# Patient Record
Sex: Male | Born: 1945 | Race: White | Hispanic: No | Marital: Married | Smoking: Never smoker
Health system: Southern US, Community
[De-identification: ages and names within clinical notes are randomized; demographics above are authoritative.]

## PROBLEM LIST (undated history)

## (undated) DIAGNOSIS — E119 Type 2 diabetes mellitus without complications: Secondary | ICD-10-CM

## (undated) DIAGNOSIS — N200 Calculus of kidney: Secondary | ICD-10-CM

## (undated) DIAGNOSIS — I10 Essential (primary) hypertension: Secondary | ICD-10-CM

## (undated) DIAGNOSIS — K219 Gastro-esophageal reflux disease without esophagitis: Secondary | ICD-10-CM

## (undated) HISTORY — DX: Essential (primary) hypertension: I10

## (undated) HISTORY — PX: KNEE SURGERY: SHX244

## (undated) HISTORY — DX: Gastro-esophageal reflux disease without esophagitis: K21.9

## (undated) HISTORY — DX: Type 2 diabetes mellitus without complications: E11.9

---

## 1997-08-23 ENCOUNTER — Encounter: Admission: RE | Admit: 1997-08-23 | Discharge: 1997-11-21 | Payer: Self-pay | Admitting: *Deleted

## 2008-07-25 ENCOUNTER — Encounter: Admission: RE | Admit: 2008-07-25 | Discharge: 2008-10-23 | Payer: Self-pay | Admitting: Orthopedic Surgery

## 2008-10-24 ENCOUNTER — Encounter: Admission: RE | Admit: 2008-10-24 | Discharge: 2008-12-04 | Payer: Self-pay | Admitting: Orthopedic Surgery

## 2012-11-09 ENCOUNTER — Ambulatory Visit: Payer: Self-pay | Admitting: Orthopedic Surgery

## 2012-11-23 ENCOUNTER — Ambulatory Visit: Payer: Self-pay | Admitting: Orthopedic Surgery

## 2012-12-07 ENCOUNTER — Ambulatory Visit (INDEPENDENT_AMBULATORY_CARE_PROVIDER_SITE_OTHER): Payer: Medicare Other

## 2012-12-07 ENCOUNTER — Encounter: Payer: Self-pay | Admitting: Orthopedic Surgery

## 2012-12-07 ENCOUNTER — Ambulatory Visit (INDEPENDENT_AMBULATORY_CARE_PROVIDER_SITE_OTHER): Payer: Medicare Other | Admitting: Orthopedic Surgery

## 2012-12-07 VITALS — BP 144/110 | Ht 67.5 in | Wt 238.0 lb

## 2012-12-07 DIAGNOSIS — M1710 Unilateral primary osteoarthritis, unspecified knee: Secondary | ICD-10-CM

## 2012-12-07 DIAGNOSIS — M179 Osteoarthritis of knee, unspecified: Secondary | ICD-10-CM | POA: Insufficient documentation

## 2012-12-07 DIAGNOSIS — Z9889 Other specified postprocedural states: Secondary | ICD-10-CM

## 2012-12-07 DIAGNOSIS — M25569 Pain in unspecified knee: Secondary | ICD-10-CM

## 2012-12-07 DIAGNOSIS — M25561 Pain in right knee: Secondary | ICD-10-CM

## 2012-12-07 DIAGNOSIS — IMO0002 Reserved for concepts with insufficient information to code with codable children: Secondary | ICD-10-CM | POA: Diagnosis not present

## 2012-12-07 MED ORDER — DICLOFENAC POTASSIUM 50 MG PO TABS: 50.0000 mg | ORAL_TABLET | Freq: Two times a day (BID) | ORAL | Status: DC

## 2012-12-07 NOTE — Patient Instructions (Addendum)
Wear and Tear Disorders of the Knee (Arthritis, Osteoarthritis) Everyone will experience wear and tear injuries (arthritis, osteoarthritis) of the knee. These are the changes we all get as we age. They come from the joint stress of daily living. The amount of cartilage damage in your knee and your symptoms determine if you need surgery.    WHAT IS ARTHRITIS OF THE KNEE?  Arthritis of the knee is most often osteoarthritis. In this disease, the cartilage in the joint gradually wears away. It may be caused by excess stress on the joint from:  Trauma.  Deformity.  Repeated injury.  Excess weight.  It most often affects middle-aged and older people. A young person who develops osteoarthritis may have an inherited form of the disease or may have experienced continuous irritation from an unrepaired knee injury or other injury.  In rheumatoid arthritis, which can also affect the knees, the joint becomes inflamed and cartilage may be destroyed. Rheumatoid arthritis often affects people at an earlier age than osteoarthritis and often involves multiple joints.  Arthritis can also affect supporting structures such as muscles, tendons, and ligaments. WHAT ARE SIGNS OF ARTHRITIS OF THE KNEE?  Someone who has arthritis of the knee may experience:  Pain.  Swelling/ fluid on the knee.  A decrease in knee motion.  A common symptom is morning stiffness. This generally improves as the person moves around.  Sometimes the joint locks or clicks. These signs may occur in other knee disorders as well.  The caregiver may confirm the diagnosis by:  Performing a physical examination.  Examining x-rays, which typically show a loss of joint space.  Blood tests may be helpful for diagnosing rheumatoid arthritis, but other tests may be needed.  Analyzing fluid from the knee joint may be helpful in diagnosing some kinds of arthritis.  The caregiver may use arthroscopy to directly see damage to cartilage, tendons, and  ligaments and to confirm a diagnosis. Arthroscopy is usually done only if a repair procedure is to be performed. WHAT IS TREATMENT FOR ARTHRITIS OF THE KNEE?  Most often osteoarthritis of the knee is treated with pain-reducing medicines, such as:  Nonsteroidal anti-inflammatory drugs (NSAID's),  Start medication for arthritis take as needed Exercises to restore joint movement and strengthen the knee.  Losing excess weight can also help people with osteoarthritis.  Rheumatoid arthritis of the knee may require physical therapy and more powerful medicines. In people with severe arthritis of the knee, a seriously damaged joint may need to be replaced with an artificial one.

## 2012-12-07 NOTE — Progress Notes (Signed)
  Subjective:    George Ballard is a 67 y.o. male who presents with knee pain involving the right knee. Onset was gradual, starting about several weeks ago. Inciting event: none known. Current symptoms include: pain located lateral joint line  and swelling. Pain is aggravated by walking and the knee will go into extension with pain . Patient has had prior knee problems. Evaluation to date: none. Treatment to date: none and of note he had SARK menisectomy 16 years ago.  The following portions of the patient's history were reviewed and updated as appropriate: allergies, current medications, past family history, past medical history, past social history, past surgical history and problem list.   Review of Systems A comprehensive review of systems was negative except for: Constitutional: positive for fevers Respiratory: positive for cough Gastrointestinal: positive for dyspepsia and melena Genitourinary: positive for hematuria Musculoskeletal: positive for arthralgias and stiff joints Behavioral/Psych: positive for anxiety   Objective:    BP 144/110  Ht 5' 7.5" (1.715 m)  Wt 238 lb (107.956 kg)  BMI 36.7 kg/m2 Right knee: positive exam findings: effusion, medial joint line tenderness, lateral joint line tenderness, positive McMurray and + Apley and screw home test , negative exam findings: no erythema, ACL stable, PCL stable, MCL stable, LCL stable, no patellar laxity and motor normal  and aligment seems mild varus  Left knee:  normal, no effusion, full active range of motion, no joint line tenderness, ligamentous structures intact. and motor normal   Upper extremity exam  The right and left upper extremity:   Inspection and palpation revealed no abnormalities in the upper extremities.   Range of motion is full without contracture.  Motor exam is normal with grade 5 strength.  The joints are fully reduced without subluxation.  There is no atrophy or tremor and muscle tone is  normal.  All joints are stable.  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 Questions answered. Transport planner distributed. NSAIDs per medication orders.

## 2013-03-07 ENCOUNTER — Ambulatory Visit (INDEPENDENT_AMBULATORY_CARE_PROVIDER_SITE_OTHER): Payer: Medicare Other | Admitting: *Deleted

## 2013-03-07 ENCOUNTER — Encounter (INDEPENDENT_AMBULATORY_CARE_PROVIDER_SITE_OTHER): Payer: Self-pay

## 2013-03-07 DIAGNOSIS — Z23 Encounter for immunization: Secondary | ICD-10-CM

## 2013-03-20 ENCOUNTER — Encounter (HOSPITAL_COMMUNITY): Payer: Self-pay | Admitting: Emergency Medicine

## 2013-03-20 ENCOUNTER — Emergency Department (HOSPITAL_COMMUNITY)
Admission: EM | Admit: 2013-03-20 | Discharge: 2013-03-20 | Disposition: A | Discharge status: Home / Self Care | Payer: Medicare Other | Attending: Emergency Medicine | Admitting: Emergency Medicine

## 2013-03-20 ENCOUNTER — Emergency Department (HOSPITAL_COMMUNITY): Payer: Medicare Other

## 2013-03-20 DIAGNOSIS — Z79899 Other long term (current) drug therapy: Secondary | ICD-10-CM | POA: Diagnosis not present

## 2013-03-20 DIAGNOSIS — N201 Calculus of ureter: Secondary | ICD-10-CM | POA: Diagnosis not present

## 2013-03-20 DIAGNOSIS — N1330 Unspecified hydronephrosis: Secondary | ICD-10-CM | POA: Insufficient documentation

## 2013-03-20 DIAGNOSIS — N200 Calculus of kidney: Secondary | ICD-10-CM

## 2013-03-20 DIAGNOSIS — E119 Type 2 diabetes mellitus without complications: Secondary | ICD-10-CM | POA: Diagnosis not present

## 2013-03-20 DIAGNOSIS — R61 Generalized hyperhidrosis: Secondary | ICD-10-CM | POA: Insufficient documentation

## 2013-03-20 DIAGNOSIS — I10 Essential (primary) hypertension: Secondary | ICD-10-CM | POA: Insufficient documentation

## 2013-03-20 DIAGNOSIS — R110 Nausea: Secondary | ICD-10-CM | POA: Insufficient documentation

## 2013-03-20 DIAGNOSIS — Z87442 Personal history of urinary calculi: Secondary | ICD-10-CM | POA: Insufficient documentation

## 2013-03-20 DIAGNOSIS — K219 Gastro-esophageal reflux disease without esophagitis: Secondary | ICD-10-CM | POA: Insufficient documentation

## 2013-03-20 HISTORY — DX: Calculus of kidney: N20.0

## 2013-03-20 LAB — URINALYSIS, ROUTINE W REFLEX MICROSCOPIC
Bilirubin Urine: NEGATIVE
Nitrite: NEGATIVE
Protein, ur: NEGATIVE mg/dL
Specific Gravity, Urine: 1.024 (ref 1.005–1.030)
Urobilinogen, UA: 0.2 mg/dL (ref 0.0–1.0)

## 2013-03-20 LAB — COMPREHENSIVE METABOLIC PANEL
ALT: 95 U/L — ABNORMAL HIGH (ref 0–53)
CO2: 30 mEq/L (ref 19–32)
Calcium: 9.1 mg/dL (ref 8.4–10.5)
Chloride: 99 mEq/L (ref 96–112)
Creatinine, Ser: 1.03 mg/dL (ref 0.50–1.35)
GFR calc Af Amer: 85 mL/min — ABNORMAL LOW (ref >90)
GFR calc non Af Amer: 74 mL/min — ABNORMAL LOW (ref >90)
Glucose, Bld: 215 mg/dL — ABNORMAL HIGH (ref 70–99)
Total Bilirubin: 0.4 mg/dL (ref 0.3–1.2)

## 2013-03-20 LAB — CBC WITH DIFFERENTIAL/PLATELET
Eosinophils Relative: 2 % (ref 0–5)
HCT: 43.9 % (ref 39.0–52.0)
Hemoglobin: 14.6 g/dL (ref 13.0–17.0)
Lymphocytes Relative: 14 % (ref 12–46)
Lymphs Abs: 1.1 10*3/uL (ref 0.7–4.0)
MCV: 91.3 fL (ref 78.0–100.0)
Monocytes Absolute: 0.4 10*3/uL (ref 0.1–1.0)
Neutro Abs: 6.2 10*3/uL (ref 1.7–7.7)
RBC: 4.81 MIL/uL (ref 4.22–5.81)
WBC: 7.9 10*3/uL (ref 4.0–10.5)

## 2013-03-20 LAB — URINE MICROSCOPIC-ADD ON

## 2013-03-20 MED ORDER — HYDROMORPHONE HCL PF 1 MG/ML IJ SOLN
1.0000 mg | Freq: Once | INTRAMUSCULAR | Status: AC
  Administered 2013-03-20: 1 mg via INTRAVENOUS
  Filled 2013-03-20: qty 1

## 2013-03-20 MED ORDER — ONDANSETRON HCL 4 MG PO TABS: 4.0000 mg | ORAL_TABLET | Freq: Three times a day (TID) | ORAL | Status: DC | PRN

## 2013-03-20 MED ORDER — KETOROLAC TROMETHAMINE 30 MG/ML IJ SOLN
30.0000 mg | Freq: Once | INTRAMUSCULAR | Status: AC
  Administered 2013-03-20: 30 mg via INTRAVENOUS
  Filled 2013-03-20: qty 1

## 2013-03-20 MED ORDER — TAMSULOSIN HCL 0.4 MG PO CAPS: 0.4000 mg | ORAL_CAPSULE | Freq: Every day | ORAL | Status: DC

## 2013-03-20 MED ORDER — OXYCODONE-ACETAMINOPHEN 5-325 MG PO TABS: 1.0000 | ORAL_TABLET | ORAL | Status: DC | PRN

## 2013-03-20 NOTE — ED Provider Notes (Signed)
CSN: [id number]     Arrival date & time 03/20/13  1159 History   First MD Initiated Contact with Patient 03/20/13 1214     Chief Complaint  Patient presents with  . Flank Pain   (Consider location/radiation/quality/duration/timing/severity/associated sxs/prior Treatment) HPI  George Ballard Is a 67 year old male with past medical history of hypertension, GERD, diabetes and previous kidney stone who presents emergency department chief complaint of left flank pain.  Patient states that 2 nights ago he was awoken from sleep with severe left-sided stabbing lower quadrant abdominal pain that lasted approximately 5 minutes with associated diaphoresis and nausea.  Patient states it eased up and he was able to go back to sleep.  This morning the patient was with his wife approximately 3 hours ago having a late breakfast.  As he was driving home he said sudden onset severe left flank and left lower quadrant abdominal pain which he describes as sharp, 10 out of 10.  The patient became nauseated, diaphoretic, no vomiting.  The patient states that the pain would come and go in waves.  He denies any hematuria, dysuria, frequency.  Patient states that this feels similar to a previous kidney stone he had.  He states that he had no CT scan at that time however the diagnosis was given as a result of his clinical impression. Denies fevers, chills, myalgias, arthralgias. Denies DOE, SOB, chest tightness or pressure, radiation to left arm, jaw or back, or diaphoresis.  Denies headaches, light headedness, weakness, visual disturbances. Denies abdominal pain, nausea, vomiting, diarrhea or constipation.    Past Medical History  Diagnosis Date  . HTN (hypertension) gerd  . GERD (gastroesophageal reflux disease)   . DM (diabetes mellitus)   . Kidney stones    Past Surgical History  Procedure Laterality Date  . Knee surgery Right     sark 1998?   No family history on file. History  Substance Use Topics   . Smoking status: Never Smoker   . Smokeless tobacco: Not on file  . Alcohol Use: No    Review of Systems Ten systems reviewed and are negative for acute change, except as noted in the HPI.   Allergies  Review of patient's allergies indicates no known allergies.  Home Medications   Current Outpatient Rx  Name  Route  Sig  Dispense  Refill  . diclofenac (CATAFLAM) 50 MG tablet   Oral   Take 1 tablet (50 mg total) by mouth 2 (two) times daily.   90 tablet   3     Take as needed    BP 139/82  Pulse 77  Temp(Src) 97.7 F (36.5 C) (Oral)  Resp 18  Wt 241 lb 1 oz (109.345 kg)  BMI 37.18 kg/m2  SpO2 98% Physical Exam Physical Exam  Nursing note and vitals reviewed. Constitutional: Obese male in no acute distress HENT:  Head: Normocephalic and atraumatic.  Eyes: Conjunctivae normal are normal. No scleral icterus.  Neck: Normal range of motion. Neck supple.  Cardiovascular: Normal rate, regular rhythm and normal heart sounds.   Pulmonary/Chest: Effort normal and breath sounds normal. No respiratory distress.  Abdominal: Soft, protuberant,. There is no tenderness.  normal bowel sounds, no CVA tenderness  Musculoskeletal: He exhibits no edema.  Neurological: He is alert.  Skin: Skin is warm and dry. He is not diaphoretic.  Psychiatric: His behavior is normal.    ED Course  Procedures (including critical care time) Labs Review Labs Reviewed  CBC WITH DIFFERENTIAL - Abnormal;  Notable for the following:    Neutrophils Relative % 78 (*)    All other components within normal limits  COMPREHENSIVE METABOLIC PANEL - Abnormal; Notable for the following:    Glucose, Bld 215 (*)    AST 59 (*)    ALT 95 (*)    GFR calc non Af Amer 74 (*)    GFR calc Af Amer 85 (*)    All other components within normal limits  URINALYSIS, ROUTINE W REFLEX MICROSCOPIC - Abnormal; Notable for the following:    Hgb urine dipstick LARGE (*)    All other components within normal limits  URINE  MICROSCOPIC-ADD ON   Imaging Review No results found.  EKG Interpretation   None       MDM   1. Kidney stone    1:00 PM Patient is comfortable at this time and declined pain medication.  Suspect possible kidney stone considering his history.  Labs are currently pending.  I do not suspect acute coronary syndrome.  The patient does have risk factors such as hypertension, diabetes, and obesity.  He has no chest pain or shortness of breath.     Pt has been diagnosed with a Kidney Stone via CT. There is no evidence of significant hydronephrosis, serum creatine WNL, vitals sign stable and the pt does not have irratractable vomiting. Pt will be dc home with pain medications & has been advised to follow up with PCP.    Arthor Captain, PA-C 03/22/13 2111

## 2013-03-20 NOTE — ED Notes (Signed)
Pt has sharp pain that radiate from left lower back and around to belly.  Pt states he had it 3 days ago and resolved and then it restarted today.

## 2013-03-20 NOTE — ED Notes (Signed)
Pt discharged.Vital signs stable and GCS 15.Discharge instruction given. 

## 2013-03-24 NOTE — ED Provider Notes (Signed)
Medical screening examination/treatment/procedure(s) were performed by non-physician practitioner and as supervising physician I was immediately available for consultation/collaboration.  EKG Interpretation   None         Audree Camel, MD 03/24/13 [phone]

## 2013-04-20 DIAGNOSIS — N200 Calculus of kidney: Secondary | ICD-10-CM | POA: Diagnosis not present

## 2013-04-20 DIAGNOSIS — R109 Unspecified abdominal pain: Secondary | ICD-10-CM | POA: Diagnosis not present

## 2013-04-20 DIAGNOSIS — N201 Calculus of ureter: Secondary | ICD-10-CM | POA: Diagnosis not present

## 2013-05-02 DIAGNOSIS — N201 Calculus of ureter: Secondary | ICD-10-CM | POA: Diagnosis not present

## 2013-05-03 ENCOUNTER — Ambulatory Visit: Payer: Medicare Other | Admitting: Family Medicine

## 2013-06-08 ENCOUNTER — Ambulatory Visit (INDEPENDENT_AMBULATORY_CARE_PROVIDER_SITE_OTHER): Payer: Medicare Other | Admitting: Orthopedic Surgery

## 2013-06-08 ENCOUNTER — Ambulatory Visit (INDEPENDENT_AMBULATORY_CARE_PROVIDER_SITE_OTHER): Payer: Medicare Other

## 2013-06-08 VITALS — BP 151/92 | Ht 67.5 in | Wt 238.0 lb

## 2013-06-08 DIAGNOSIS — IMO0002 Reserved for concepts with insufficient information to code with codable children: Secondary | ICD-10-CM

## 2013-06-08 DIAGNOSIS — M1710 Unilateral primary osteoarthritis, unspecified knee: Secondary | ICD-10-CM

## 2013-06-08 DIAGNOSIS — M1711 Unilateral primary osteoarthritis, right knee: Secondary | ICD-10-CM

## 2013-06-08 NOTE — Patient Instructions (Signed)

## 2013-06-08 NOTE — Progress Notes (Signed)
Patient ID: George CarrowGlenn Dellamae Rosamilia Keitt, male   DOB: 02/21/1946, 68 y.o.   MRN: [id number] Chief Complaint  Patient presents with  . Follow-up    6onth recheck right knee OA    BP 151/92  Ht 5' 7.5" (1.715 m)  Wt 238 lb (107.956 kg)  BMI 36.70 kg/m2  This is a 6 month followup status post evaluation of right knee for osteoarthritis patient been doing well with mild aching pain in his knee. He is interested in another injection if we be necessary  Aching pain no catching locking or giving way  The knee looks good today there is no effusion his range of motion is 125 ligaments are stable motor exam is normal skin is intact he ambulates without assistive device. His vital signs are stable BP 151/92  Ht 5' 7.5" (1.715 m)  Wt 238 lb (107.956 kg)  BMI 36.70 kg/m2   X-ray show no progression of his arthritis   Diagnosis osteoarthritis  Inject right knee  Knee  Injection Procedure Note  Pre-operative Diagnosis: right knee oa  Post-operative Diagnosis: same  Indications: pain  Anesthesia: ethyl chloride   Procedure Details   Verbal consent was obtained for the procedure. Time out was completed.The joint was prepped with alcohol, followed by  Ethyl chloride spray and A 20 gauge needle was inserted into the knee via lateral approach; 4ml 1% lidocaine and 1 ml of depomedrol  was then injected into the joint . The needle was removed and the area cleansed and dressed.  Complications:  None; patient tolerated the procedure well.

## 2013-12-12 ENCOUNTER — Encounter: Payer: Medicare Other | Admitting: Orthopedic Surgery

## 2013-12-12 ENCOUNTER — Encounter: Payer: Self-pay | Admitting: Orthopedic Surgery

## 2013-12-12 ENCOUNTER — Ambulatory Visit: Payer: Medicare Other

## 2014-06-11 DIAGNOSIS — Z23 Encounter for immunization: Secondary | ICD-10-CM | POA: Diagnosis not present

## 2014-07-11 ENCOUNTER — Encounter (INDEPENDENT_AMBULATORY_CARE_PROVIDER_SITE_OTHER): Payer: Self-pay

## 2014-07-11 ENCOUNTER — Encounter: Payer: Self-pay | Admitting: Family Medicine

## 2014-07-11 ENCOUNTER — Ambulatory Visit (INDEPENDENT_AMBULATORY_CARE_PROVIDER_SITE_OTHER): Payer: Medicare Other | Admitting: Family Medicine

## 2014-07-11 VITALS — BP 125/88 | HR 81 | Temp 97.8°F | Ht 67.5 in | Wt 241.0 lb

## 2014-07-11 DIAGNOSIS — I10 Essential (primary) hypertension: Secondary | ICD-10-CM

## 2014-07-11 DIAGNOSIS — E119 Type 2 diabetes mellitus without complications: Secondary | ICD-10-CM

## 2014-07-11 MED ORDER — LOSARTAN POTASSIUM 100 MG PO TABS: 50.0000 mg | ORAL_TABLET | Freq: Every day | ORAL | Status: DC

## 2014-07-11 MED ORDER — HYDROCHLOROTHIAZIDE 25 MG PO TABS: 25.0000 mg | ORAL_TABLET | Freq: Every day | ORAL | Status: DC

## 2014-07-11 MED ORDER — LOSARTAN POTASSIUM-HCTZ 100-25 MG PO TABS: 1.0000 | ORAL_TABLET | Freq: Every day | ORAL | Status: DC

## 2014-07-11 MED ORDER — OMEPRAZOLE 20 MG PO CPDR: 20.0000 mg | DELAYED_RELEASE_CAPSULE | Freq: Every day | ORAL | Status: DC

## 2014-07-11 NOTE — Progress Notes (Signed)
Subjective:  Patient ID: George Ballard, male    DOB: [birth year]  Age: 69 y.o. MRN: [id number]  CC: Hypertension   HPI [name] presents for Patient in for follow-up of hypertension. Patient has no history of headache chest pain or shortness of breath or recent cough. Patient also denies symptoms of TIA such as numbness weakness lateralizing. Patient checks  blood pressure at home and has not had any elevated readings recently. Patient denies side effects from his medication. States taking it regularly. Patient does not check blood sugar at home Patient denies symptoms such as polyuria, polydipsia, excessive hunger, nausea No significant hypoglycemic spells noted. Medications as noted below. Taking them regularly without complication/adverse reaction being reported today.   Patient in for follow-up of GERD. He is currently asymptomatic taking his PPI daily. There is no chest pain or heartburn. He is taking the medication regularly. No dysphagia or choking.   History Day has a past medical history of HTN (hypertension) (gerd); GERD (gastroesophageal reflux disease); DM (diabetes mellitus); and Kidney stones.   He has past surgical history that includes Knee surgery (Right).   His family history is not on file.He reports that he has never smoked. He does not have any smokeless tobacco history on file. He reports that he does not drink alcohol or use illicit drugs.  Current Outpatient Prescriptions on File Prior to Visit  Medication Sig Dispense Refill  . ibuprofen (ADVIL,MOTRIN) 200 MG tablet Take 600 mg by mouth every 6 (six) hours as needed for pain.    . metFORMIN (GLUCOPHAGE) 500 MG tablet Take 500 mg by mouth daily.     No current facility-administered medications on file prior to visit.    ROS Review of Systems  Constitutional: Negative for fever, chills, diaphoresis and unexpected weight change.  HENT: Negative for congestion, hearing loss, rhinorrhea,  sore throat and trouble swallowing.   Respiratory: Negative for cough, chest tightness, shortness of breath and wheezing.   Gastrointestinal: Negative for nausea, vomiting, abdominal pain, diarrhea, constipation and abdominal distention.  Endocrine: Negative for cold intolerance and heat intolerance.  Genitourinary: Negative for dysuria, hematuria and flank pain.  Musculoskeletal: Negative for joint swelling and arthralgias.  Skin: Negative for rash.  Neurological: Negative for dizziness and headaches.  Psychiatric/Behavioral: Negative for dysphoric mood, decreased concentration and agitation. The patient is not nervous/anxious.     Objective:  BP 125/88 mmHg  Pulse 81  Temp(Src) 97.8 F (36.6 C) (Oral)  Ht 5' 7.5" (1.715 m)  Wt 241 lb (109.317 kg)  BMI 37.17 kg/m2  BP Readings from Last 3 Encounters:  07/11/14 125/88  06/08/13 151/92  03/20/13 115/62    Wt Readings from Last 3 Encounters:  07/11/14 241 lb (109.317 kg)  06/08/13 238 lb (107.956 kg)  03/20/13 241 lb 1 oz (109.345 kg)     Physical Exam  Constitutional: He is oriented to person, place, and time. He appears well-developed and well-nourished. No distress.  HENT:  Head: Normocephalic and atraumatic.  Right Ear: External ear normal.  Left Ear: External ear normal.  Nose: Nose normal.  Mouth/Throat: Oropharynx is clear and moist.  Eyes: Conjunctivae and EOM are normal. Pupils are equal, round, and reactive to light.  Neck: Normal range of motion. Neck supple. No thyromegaly present.  Cardiovascular: Normal rate, regular rhythm and normal heart sounds.   No murmur heard. Pulmonary/Chest: Effort normal and breath sounds normal. No respiratory distress. He has no wheezes. He has no rales.  Abdominal: Soft. Bowel  sounds are normal. He exhibits no distension. There is no tenderness.  Lymphadenopathy:    He has no cervical adenopathy.  Neurological: He is alert and oriented to person, place, and time. He has normal  reflexes.  Skin: Skin is warm and dry.  Psychiatric: He has a normal mood and affect. His behavior is normal. Judgment and thought content normal.    No results found for: HGBA1C  Lab Results  Component Value Date   WBC 7.9 03/20/2013   HGB 14.6 03/20/2013   HCT 43.9 03/20/2013   PLT 213 03/20/2013   GLUCOSE 215* 03/20/2013   ALT 95* 03/20/2013   AST 59* 03/20/2013   NA 137 03/20/2013   K 3.5 03/20/2013   CL 99 03/20/2013   CREATININE 1.03 03/20/2013   BUN 17 03/20/2013   CO2 30 03/20/2013    Ct Abdomen Pelvis Wo Contrast  03/20/2013   CLINICAL DATA:  Abdominal and flank pain  EXAM: CT ABDOMEN AND PELVIS WITHOUT CONTRAST  TECHNIQUE: Multidetector CT imaging of the abdomen and pelvis was performed following the standard protocol without oral or intravenous contrast.  COMPARISON:  None.  FINDINGS: The lung bases are clear.  There is fatty change in the liver. There is a focal calcification in the right lobe of posteriorly near the dome, upright representing a granuloma. No other focal liver lesions are identified on this noncontrast enhanced study. There is no biliary duct dilatation. Gallbladder wall is not thickened.  Spleen, pancreas, and adrenals appear normal.  Kidneys bilaterally show no demonstrable mass or calculus on either side. There is no hydronephrosis on the right. There is rather minimal hydronephrosis on the left. There is a 3 mm calculus in the distal left ureter, best seen on axial slice 73. There is subtle edema are surrounding the distal left kidney. No other ureteral calculi are identified.  In the pelvis, there are sigmoid diverticula without diverticulitis. There is no pelvic mass or fluid collection. The appendix appears normal.  There is no bowel obstruction. No free air or portal venous air. There is a minimal ventral hernia containing only fat.  There is no ascites, adenopathy, or abscess in the abdomen or pelvis. Aorta is non aneurysmal. There is degenerative  change in the lumbar spine. There are no blastic or lytic bone lesions.  IMPRESSION: 3 mm calculus distal left ureter with rather minimal hydronephrosis on the left.  No mesenteric inflammation or abscess.  Fatty liver with calcified granuloma in the posterior right dome region.  There are sigmoid diverticula without diverticulitis. There is no abscess or bowel obstruction. Appendix appears normal.  Minimal ventral hernia containing only fat.   Electronically Signed   By: Bretta BangWilliam  Woodruff M.D.   On: 03/20/2013 14:32    Assessment & Plan:   Sherrine MaplesGlenn was seen today for hypertension.  Diagnoses and all orders for this visit:  Essential hypertension  Other orders -     hydrochlorothiazide (HYDRODIURIL) 25 MG tablet; Take 1 tablet (25 mg total) by mouth daily. -     losartan (COZAAR) 100 MG tablet; Take 0.5 tablets (50 mg total) by mouth daily. -     omeprazole (PRILOSEC) 20 MG capsule; Take 1 capsule (20 mg total) by mouth daily. -     losartan-hydrochlorothiazide (HYZAAR) 100-25 MG per tablet; Take 1 tablet by mouth daily.   I have discontinued Mr. Plantz's oxyCODONE-acetaminophen, tamsulosin, and ondansetron. I have also changed his hydrochlorothiazide, losartan, and omeprazole. Additionally, I am having him start on losartan-hydrochlorothiazide. Lastly,  I am having him maintain his metFORMIN and ibuprofen.  Meds ordered this encounter  Medications  . hydrochlorothiazide (HYDRODIURIL) 25 MG tablet    Sig: Take 1 tablet (25 mg total) by mouth daily.    Dispense:  90 tablet    Refill:  4  . losartan (COZAAR) 100 MG tablet    Sig: Take 0.5 tablets (50 mg total) by mouth daily.    Dispense:  90 tablet    Refill:  4  . omeprazole (PRILOSEC) 20 MG capsule    Sig: Take 1 capsule (20 mg total) by mouth daily.    Dispense:  90 capsule    Refill:  4  . losartan-hydrochlorothiazide (HYZAAR) 100-25 MG per tablet    Sig: Take 1 tablet by mouth daily.    Dispense:  90 tablet    Refill:  3                                       Follow-up: Return in about 6 months (around 01/09/2015) for CPE.  Mechele Claude, M.D.

## 2014-07-16 ENCOUNTER — Telehealth: Payer: Self-pay | Admitting: Family Medicine

## 2014-07-16 MED ORDER — METFORMIN HCL 500 MG PO TABS: 500.0000 mg | ORAL_TABLET | Freq: Every day | ORAL | Status: DC

## 2014-07-16 NOTE — Telephone Encounter (Signed)
Pt was just seen but no A1C was obtained, next appt not unitil August. Last A1C was 02/2013

## 2014-07-16 NOTE — Telephone Encounter (Signed)
Per Dr Dione BoozeStacks okayed Metformin [facility]

## 2014-11-02 ENCOUNTER — Encounter: Payer: Self-pay | Admitting: *Deleted

## 2014-11-05 ENCOUNTER — Other Ambulatory Visit: Payer: Self-pay | Admitting: Family

## 2014-11-30 ENCOUNTER — Ambulatory Visit (INDEPENDENT_AMBULATORY_CARE_PROVIDER_SITE_OTHER): Payer: Medicare Other | Admitting: *Deleted

## 2014-11-30 ENCOUNTER — Encounter: Payer: Self-pay | Admitting: *Deleted

## 2014-11-30 VITALS — Ht 65.0 in | Wt 246.0 lb

## 2014-11-30 DIAGNOSIS — E119 Type 2 diabetes mellitus without complications: Secondary | ICD-10-CM | POA: Insufficient documentation

## 2014-11-30 DIAGNOSIS — Z0000 Encounter for general adult medical examination without abnormal findings: Secondary | ICD-10-CM | POA: Diagnosis not present

## 2014-11-30 DIAGNOSIS — K219 Gastro-esophageal reflux disease without esophagitis: Secondary | ICD-10-CM | POA: Insufficient documentation

## 2014-11-30 DIAGNOSIS — N200 Calculus of kidney: Secondary | ICD-10-CM | POA: Insufficient documentation

## 2014-11-30 DIAGNOSIS — I10 Essential (primary) hypertension: Secondary | ICD-10-CM | POA: Insufficient documentation

## 2014-11-30 NOTE — Patient Instructions (Addendum)
Walk for 30 minutes 3 times per week as tolerated. [facility]. Keep follow up appointments. Continue current mediations.   Health Maintenance A healthy lifestyle and preventative care can promote health and wellness.  Maintain regular health, dental, and eye exams.  Eat a healthy diet. Foods like vegetables, fruits, whole grains, low-fat dairy products, and lean protein foods contain the nutrients you need and are low in calories. Decrease your intake of foods high in solid fats, added sugars, and salt. Get information about a proper diet from your health care provider, if necessary.  Regular physical exercise is one of the most important things you can do for your health. Most adults should get at least 150 minutes of moderate-intensity exercise (any activity that increases your heart rate and causes you to sweat) each week. In addition, most adults need muscle-strengthening exercises on 2 or more days a week.   Maintain a healthy weight. The body mass index (BMI) is a screening tool to identify possible weight problems. It provides an estimate of body fat based on height and weight. Your health care provider can find your BMI and can help you achieve or maintain a healthy weight. For males 20 years and older:  A BMI below 18.5 is considered underweight.  A BMI of 18.5 to 24.9 is normal.  A BMI of 25 to 29.9 is considered overweight.  A BMI of 30 and above is considered obese.  Maintain normal blood lipids and cholesterol by exercising and minimizing your intake of saturated fat. Eat a balanced diet with plenty of fruits and vegetables. Blood tests for lipids and cholesterol should begin at age 69 and be repeated every 5 years. If your lipid or cholesterol levels are high, you are over age 69, or you are at high risk for heart disease, you may need your cholesterol levels checked more frequently.Ongoing high lipid and cholesterol levels should be treated with  medicines if diet and exercise are not working.  If you smoke, find out from your health care provider how to quit. If you do not use tobacco, do not start.  Lung cancer screening is recommended for adults aged 55-80 years who are at high risk for developing lung cancer because of a history of smoking. A yearly low-dose CT scan of the lungs is recommended for people who have at least a 30-pack-year history of smoking and are current smokers or have quit within the past 15 years. A pack year of smoking is smoking an average of 1 pack of cigarettes a day for 1 year (for example, a 30-pack-year history of smoking could mean smoking 1 pack a day for 30 years or 2 packs a day for 15 years). Yearly screening should continue until the smoker has stopped smoking for at least 15 years. Yearly screening should be stopped for people who develop a health problem that would prevent them from having lung cancer treatment.  If you choose to drink alcohol, do not have more than 2 drinks per day. One drink is considered to be 12 oz (360 mL) of beer, 5 oz (150 mL) of wine, or 1.5 oz (45 mL) of liquor.  Avoid the use of street drugs. Do not share needles with anyone. Ask for help if you need support or instructions about stopping the use of drugs.  High blood pressure causes heart disease and increases the risk of stroke. Blood pressure should be checked at least every 1-2 years. Ongoing high blood pressure  should be treated with medicines if weight loss and exercise are not effective.  If you are 44-24 years old, ask your health care provider if you should take aspirin to prevent heart disease.  Diabetes screening involves taking a blood sample to check your fasting blood sugar level. This should be done once every 3 years after age 51 if you are at a normal weight and without risk factors for diabetes. Testing should be considered at a younger age or be carried out more frequently if you are overweight and have at  least 1 risk factor for diabetes.  Colorectal cancer can be detected and often prevented. Most routine colorectal cancer screening begins at the age of 65 and continues through age 64. However, your health care provider may recommend screening at an earlier age if you have risk factors for colon cancer. On a yearly basis, your health care provider may provide home test kits to check for hidden blood in the stool. A small camera at the end of a tube may be used to directly examine the colon (sigmoidoscopy or colonoscopy) to detect the earliest forms of colorectal cancer. Talk to your health care provider about this at age 13 when routine screening begins. A direct exam of the colon should be repeated every 5-10 years through age 10, unless early forms of precancerous polyps or small growths are found.  People who are at an increased risk for hepatitis B should be screened for this virus. You are considered at high risk for hepatitis B if:  You were born in a country where hepatitis B occurs often. Talk with your health care provider about which countries are considered high risk.  Your parents were born in a high-risk country and you have not received a shot to protect against hepatitis B (hepatitis B vaccine).  You have HIV or AIDS.  You use needles to inject street drugs.  You live with, or have sex with, someone who has hepatitis B.  You are a man who has sex with other men (MSM).  You get hemodialysis treatment.  You take certain medicines for conditions like cancer, organ transplantation, and autoimmune conditions.  Hepatitis C blood testing is recommended for all people born from [birth year] through 1965 and any individual with known risk factors for hepatitis C.  Healthy men should no longer receive prostate-specific antigen (PSA) blood tests as part of routine cancer screening. Talk to your health care provider about prostate cancer screening.  Testicular cancer screening is not recommended  for adolescents or adult males who have no symptoms. Screening includes self-exam, a health care provider exam, and other screening tests. Consult with your health care provider about any symptoms you have or any concerns you have about testicular cancer.  Practice safe sex. Use condoms and avoid high-risk sexual practices to reduce the spread of sexually transmitted infections (STIs).  You should be screened for STIs, including gonorrhea and chlamydia if:  You are sexually active and are younger than 24 years.  You are older than 24 years, and your health care provider tells you that you are at risk for this type of infection.  Your sexual activity has changed since you were last screened, and you are at an increased risk for chlamydia or gonorrhea. Ask your health care provider if you are at risk.  If you are at risk of being infected with HIV, it is recommended that you take a prescription medicine daily to prevent HIV infection. This is called pre-exposure prophylaxis (  PrEP). You are considered at risk if:  You are a man who has sex with other men (MSM).  You are a heterosexual man who is sexually active with multiple partners.  You take drugs by injection.  You are sexually active with a partner who has HIV.  Talk with your health care provider about whether you are at high risk of being infected with HIV. If you choose to begin PrEP, you should first be tested for HIV. You should then be tested every 3 months for as long as you are taking PrEP.  Use sunscreen. Apply sunscreen liberally and repeatedly throughout the day. You should seek shade when your shadow is shorter than you. Protect yourself by wearing long sleeves, pants, a wide-brimmed hat, and sunglasses year round whenever you are outdoors.  Tell your health care provider of new moles or changes in moles, especially if there is a change in shape or color. Also, tell your health care provider if a mole is larger than the size  of a pencil eraser.  A one-time screening for abdominal aortic aneurysm (AAA) and surgical repair of large AAAs by ultrasound is recommended for men aged 65-75 years who are current or former smokers.  Stay current with your vaccines (immunizations). Document Released: 11/07/2007 Document Revised: 05/16/2013 Document Reviewed: 10/06/2010 [facility] Patient Information 2015 [place], [place]. This information is not intended to replace advice given to you by your health care provider. Make sure you discuss any questions you have with your health care provider.

## 2014-11-30 NOTE — Progress Notes (Signed)
Patient ID: George Ballard, male   DOB: 06/12/1945, 69 y.o.   MRN: [id number]    Subjective:   George CarrowGlenn Harrison Ballard is a 69 y.o. male who presents for an Initial Medicare Annual Wellness Visit. George Ballard is married and lives at home with his wife. He has one adult daughter, one granddaughter, and 2 great grandchildren. 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 George Ballard served in the [employer] during the [place] [employer]. He was receiving most of his medical [facility] until recently. He has decided to see Dr Darlyn ReadStacks on a regular basis for his health care. He will obtain his records from the [facility] so that we can update ours. His vaccination records are needed as well before we can update these.  Review of Systems   Cardiac Risk Factors include: male gender;hypertension;obesity (BMI >30kg/m2);sedentary lifestyle;diabetes mellitus;advanced age (>6255men, 40>65 women)  Musculoskeletal: bilateral knee pain. Hx of right knee arthroscopy. Has a small tendon tear in the left knee which was treated with a steroid injection. He is to follow up if symptoms worsen. Currently he takes Advil 400mg  a few days a week and applies Aspercreme.   Other systems negative Objective:    Weight 246lb Ht 65 BMI 40.94  Current Medications (verified) Outpatient Encounter Prescriptions as of 11/30/2014  Medication Sig  . ibuprofen (ADVIL,MOTRIN) 200 MG tablet Take 600 mg by mouth every 6 (six) hours as needed for pain.  Marland Kitchen. losartan-hydrochlorothiazide (HYZAAR) 100-25 MG per tablet Take 1 tablet by mouth daily.  . metFORMIN (GLUCOPHAGE) 500 MG tablet Take 1 tablet (500 mg total) by mouth daily.  Marland Kitchen. omeprazole (PRILOSEC) 20 MG capsule Take 1 capsule (20 mg total) by mouth daily.  . [DISCONTINUED] hydrochlorothiazide (HYDRODIURIL) 25 MG tablet Take 1 tablet (25 mg total) by mouth daily.  . [DISCONTINUED] losartan (COZAAR) 100 MG tablet Take 0.5  tablets (50 mg total) by mouth daily.   No facility-administered encounter medications on file as of 11/30/2014.    Allergies (verified) Review of patient's allergies indicates no known allergies.   History: Past Medical History  Diagnosis Date  . HTN (hypertension) gerd  . GERD (gastroesophageal reflux disease)   . DM (diabetes mellitus)   . Kidney stones    Past Surgical History  Procedure Laterality Date  . Knee surgery Right     sark 1998?   Family History  Problem Relation Age of Onset  . Diabetes Mother   . Cancer Father     brain tumor  . Cancer Sister     breast   Social History   Occupational History  . 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 Works at [employer] and delivers medications    Social History Main Topics  . Smoking status: Never Smoker   . Smokeless tobacco: Never Used  . Alcohol Use: No  . Drug Use: No  . Sexual Activity: Yes    Activities of Daily Living In your present state of health, do you have any difficulty performing the following activities: 11/30/2014  Hearing? N  Vision? N  Difficulty concentrating or making decisions? N  Walking or climbing stairs? Y  Dressing or bathing? N  Doing errands, shopping? N  Preparing Food and eating ? N  Using the Toilet? N  In the past six months, have you accidently leaked urine? N  Do you have problems with loss of bowel control? N  Managing your Medications? N  Managing your Finances? N  Housekeeping or managing your Housekeeping? N  -Difficulty with stairs due to knee pain. Manges them ok with handrails  Immunizations and Health Maintenance Immunization History  Administered Date(s) Administered  . Influenza,inj,Quad PF,36+ Mos 03/07/2013  . Influenza-Unspecified 06/11/2014  . Zoster 07/11/2010   Health Maintenance Due  Topic Date Due  . HEMOGLOBIN A1C  [birth year]  . FOOT EXAM  04/29/1956  . OPHTHALMOLOGY EXAM  04/29/1956  . URINE MICROALBUMIN  04/29/1956  . PNA vac Low  Risk Adult (1 of 2 - PCV13) 04/30/2011    Patient Care Team: Mechele Claude, MD as PCP - General (Family Medicine) Vickki Hearing, MD as Consulting Physician (Orthopedic Surgery)  Indicate any recent Medical Services you may have received from other than [facility] providers in the past year (date may be approximate). -Eye exam at [facility] around 05/2014    Assessment:   This is a routine wellness examination for George Ballard.   Hearing/Vision screen No hearing or vision deficits noted during visit today  Dietary issues and exercise activities discussed: Current Exercise Habits:: Exercise is limited by, Limited by:: orthopedic condition(s) (bilateral knee pain). His wife prepares most of his meals. For the past 6 months she has been preparing healthier meals which he calls "diet meals." They mainly consist of vegetables and baked foods like baked chicken. She has stopped frying foods.   Goal-Walk for 30 minutes 3 times per week as tolerated  Depression Screen PHQ 2/9 Scores 11/30/2014 07/11/2014  PHQ - 2 Score 0 0    Fall Risk Fall Risk  11/30/2014 07/11/2014  Falls in the past year? No No    Cognitive Function: MMSE - Mini Mental State Exam 11/30/2014  Orientation to time 5  Orientation to Place 5  Registration 3  Attention/ Calculation 5  Recall 3  Language- name 2 objects 2  Language- repeat 1  Language- follow 3 step command 3  Language- read & follow direction 1  Write a sentence 1  Copy design 1  Total score 30  -No apparent deficit  Screening Tests Health Maintenance  Topic Date Due  . HEMOGLOBIN A1C  04-Jul-1945  . FOOT EXAM  04/29/1956  . OPHTHALMOLOGY EXAM  04/29/1956  . URINE MICROALBUMIN  04/29/1956  . PNA vac Low Risk Adult (1 of 2 - PCV13) 04/30/2011  . INFLUENZA VACCINE  12/24/2014  . TETANUS/TDAP  07/12/2023  . COLONOSCOPY  05/10/2024  . ZOSTAVAX  Completed  -Waiting on vaccination record from [place]      Plan:  Walk for 30 minutes 3 times per week as tolerated. Have  records sent from [facility] for review. Keep follow up appointments. Continue current mediations.   During the course of the visit George Ballard was educated and counseled about the following appropriate screening and preventive services:   Vaccines to include Pneumoccal, Influenza, Hepatitis B, Td, Zostavax, HCV-Once we obtain vaccine records from [place] we will update these as needed.  Electrocardiogram-due at next office visit  Colorectal cancer screening-done 05/10/14  Cardiovascular disease screening  Glaucoma screening-Recent eye exam [facility]. Annual eye exam recommended.   Nutrition counseling-continue a balanced diet with lean proteins, low glycemic fruits, and vegetables  Prostate cancer screening-PSA and prostate exam recommended yearly  Patient Instructions (the written plan) were given to the patient.   Demetrios Loll, RN 11/30/2014       I have reviewed and agree with the above AWV documentation.  Mechele Claude, M.D.

## 2015-01-08 ENCOUNTER — Ambulatory Visit: Payer: Medicare Other | Admitting: Family Medicine

## 2015-01-09 ENCOUNTER — Encounter: Payer: Self-pay | Admitting: Family Medicine

## 2015-01-10 ENCOUNTER — Ambulatory Visit: Payer: Medicare Other | Admitting: Family Medicine

## 2015-01-28 IMAGING — CT CT ABD-PELV W/O CM
2 of 4 series · 17 of 46 positions shown, 19 images · non-contrast
Comparison: None.

CLINICAL DATA: Abdominal and flank pain

EXAM:
CT ABDOMEN AND PELVIS WITHOUT CONTRAST
TECHNIQUE: Multidetector CT imaging of the abdomen and pelvis was performed
following the standard protocol without oral or intravenous
contrast.

[Series 2: stone study · axial · 0.89mm/px · z∈[-512,-67]mm · 14 of 97 slices shown, 16 images]
[im 4/97  soft-tissue]
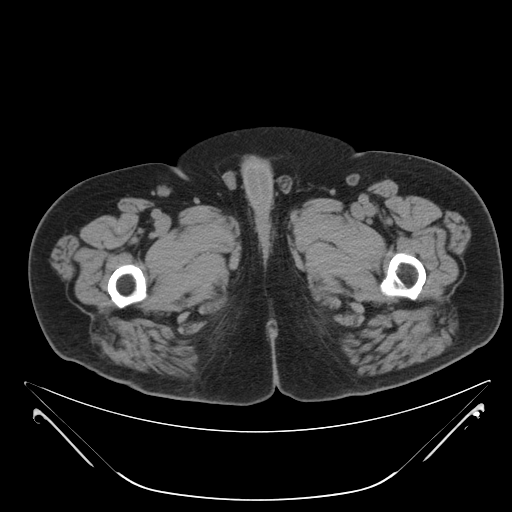
[im 4/97  bone]
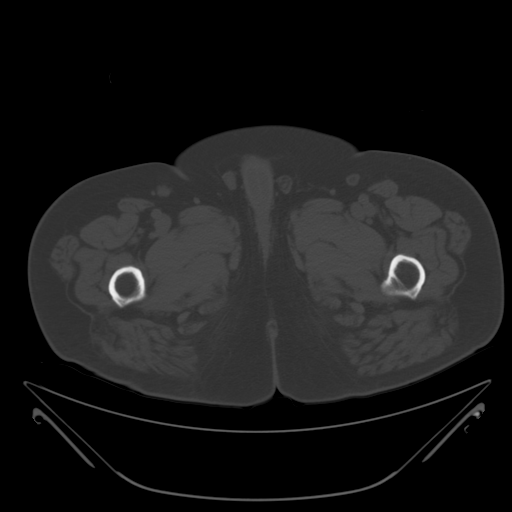
[im 12/97  soft-tissue]
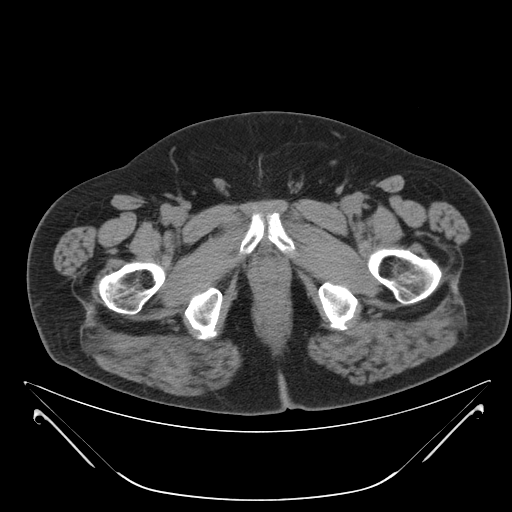
[im 20/97  soft-tissue]
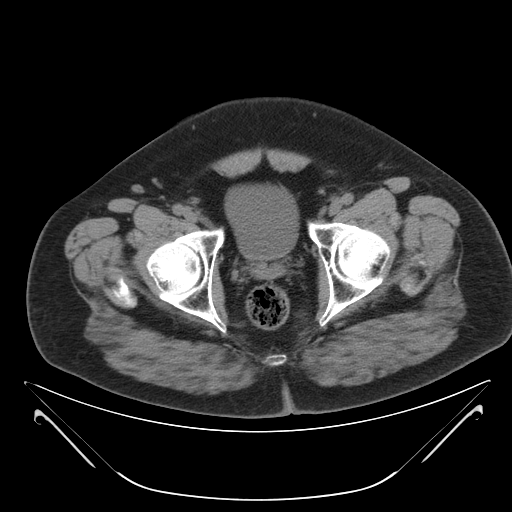
[im 27/97  soft-tissue]
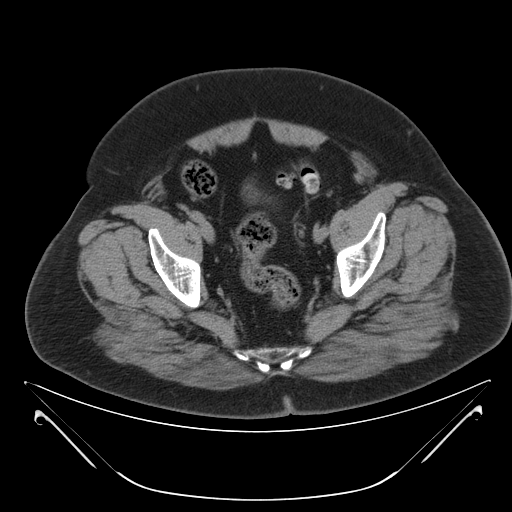
[im 31/97  soft-tissue]
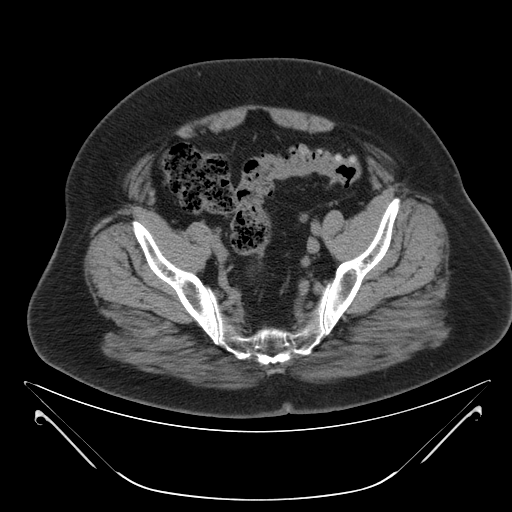
[im 39/97  soft-tissue]
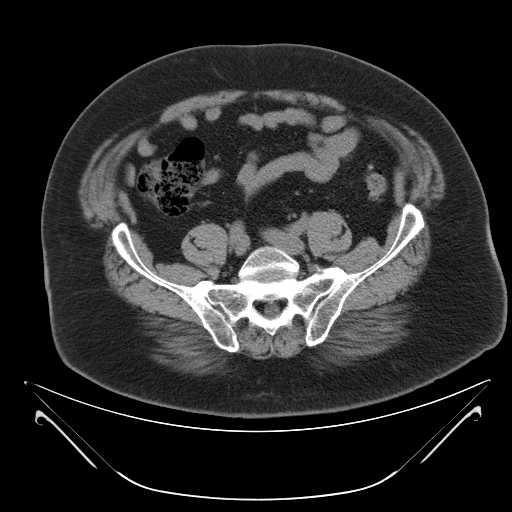
[im 47/97  soft-tissue]
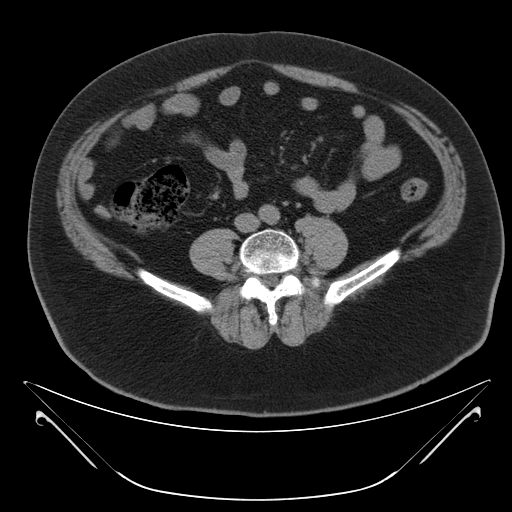
[im 50/97  soft-tissue]
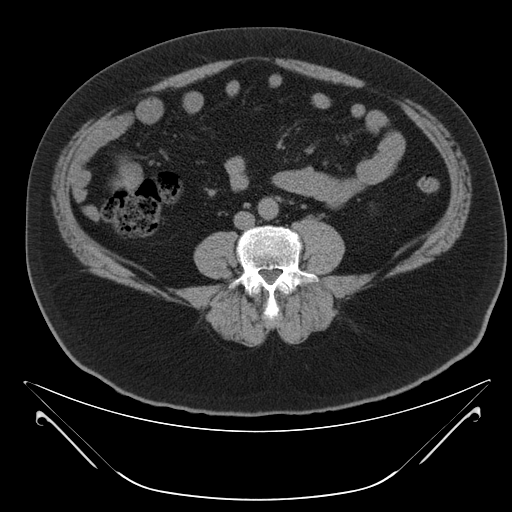
[im 58/97  soft-tissue]
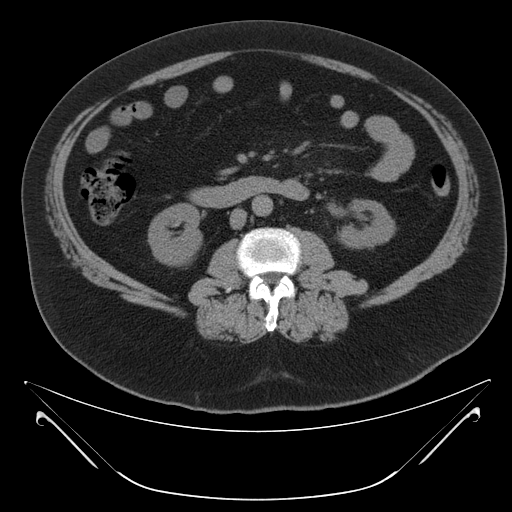
[im 58/97  bone]
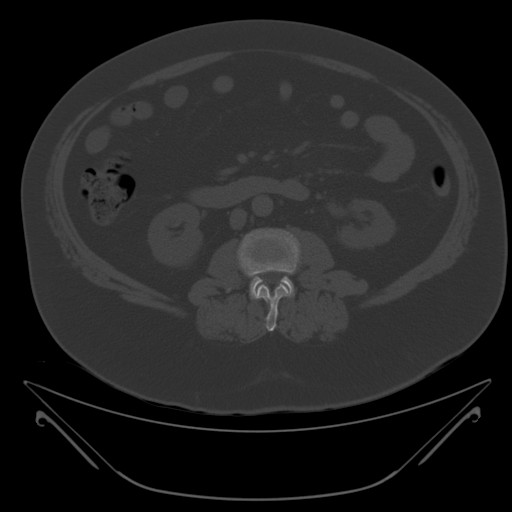
[im 66/97  soft-tissue]
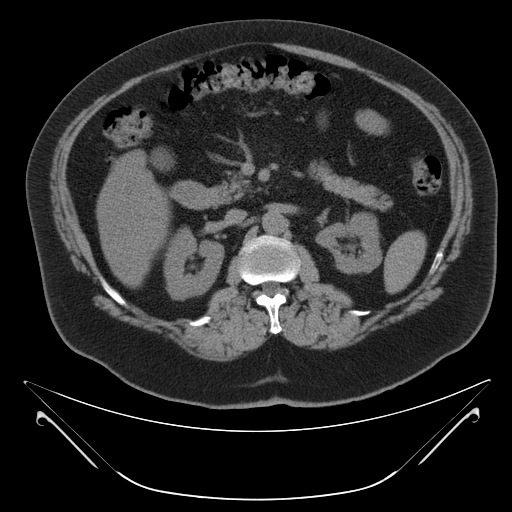
[im 73/97  soft-tissue]
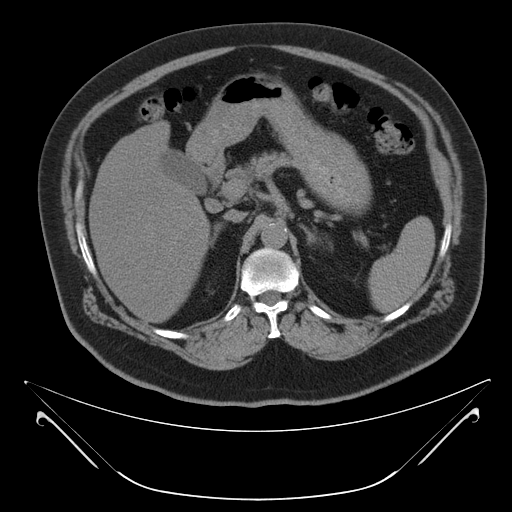
[im 77/97  soft-tissue]
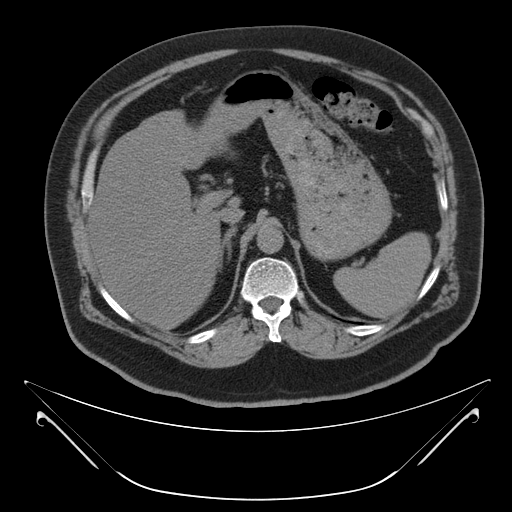
[im 85/97  soft-tissue]
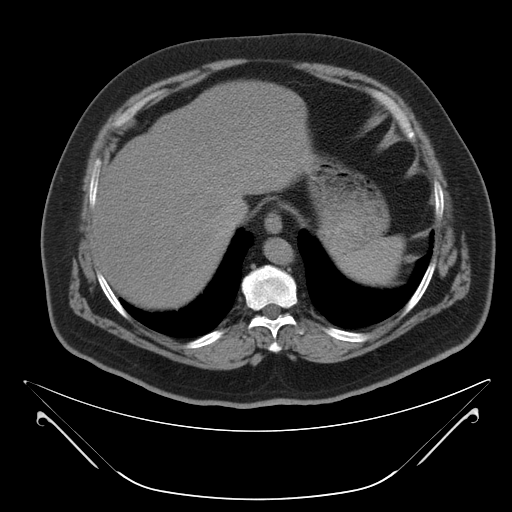
[im 93/97  soft-tissue]
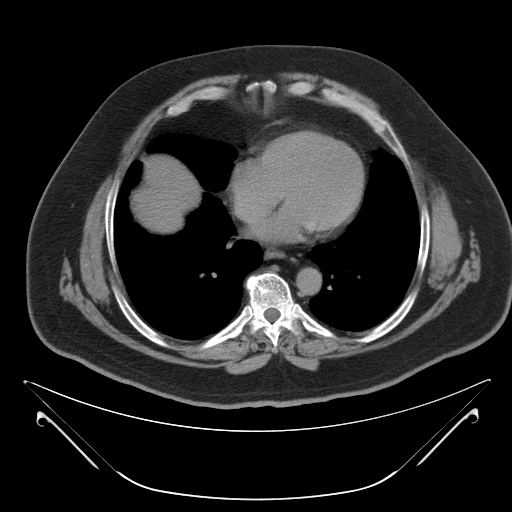

[cor · coronal · 0.94mm/px · 3 of 116 slices shown]
[im 39/116  soft-tissue]
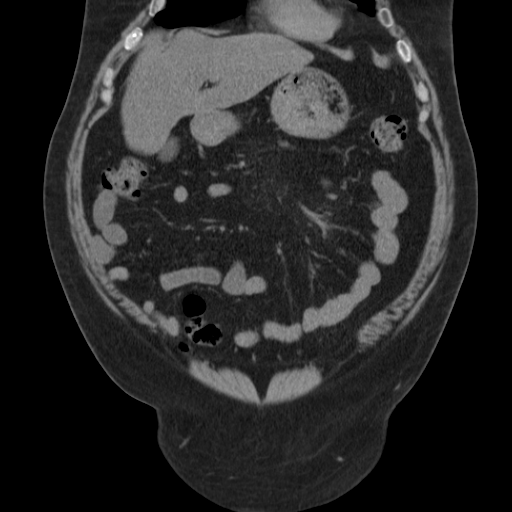
[im 52/116  soft-tissue]
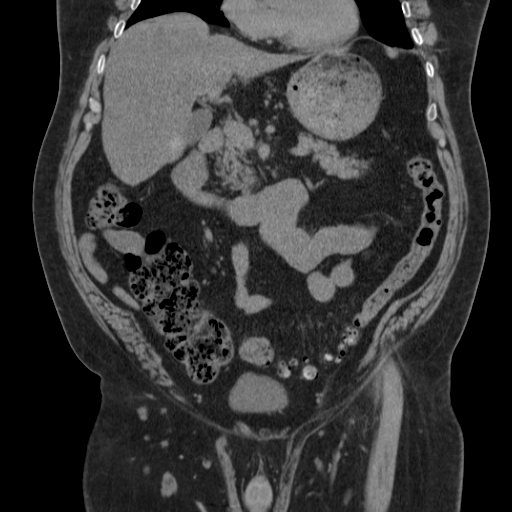
[im 64/116  soft-tissue]
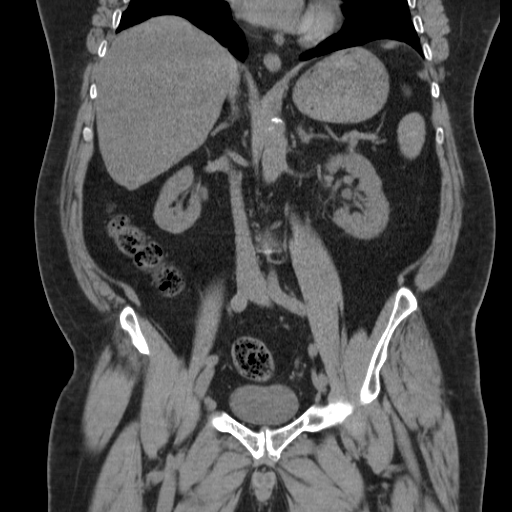

[17 of 46 positions shown; findings below may reference images not displayed]

FINDINGS: The lung bases are clear.

There is fatty change in the liver. There is a focal calcification
in the right lobe of posteriorly near the dome, upright representing
a granuloma. No other focal liver lesions are identified on this
noncontrast enhanced study. There is no biliary duct dilatation.
Gallbladder wall is not thickened.

Spleen, pancreas, and adrenals appear normal.

Kidneys bilaterally show no demonstrable mass or calculus on either
side. There is no hydronephrosis on the right. There is rather
minimal hydronephrosis on the left. There is a 3 mm calculus in the
distal left ureter, best seen on axial slice 73. There is subtle
edema are surrounding the distal left kidney. No other ureteral
calculi are identified.

In the pelvis, there are sigmoid diverticula without diverticulitis.
There is no pelvic mass or fluid collection. The appendix appears
normal.

There is no bowel obstruction. No free air or portal venous air.
There is a minimal ventral hernia containing only fat.

There is no ascites, adenopathy, or abscess in the abdomen or
pelvis. Aorta is non aneurysmal. There is degenerative change in the
lumbar spine. There are no blastic or lytic bone lesions.
IMPRESSION: 3 mm calculus distal left ureter with rather minimal hydronephrosis
on the left.

No mesenteric inflammation or abscess.

Fatty liver with calcified granuloma in the posterior right dome
region.

There are sigmoid diverticula without diverticulitis. There is no
abscess or bowel obstruction. Appendix appears normal.

Minimal ventral hernia containing only fat.

## 2015-02-01 DIAGNOSIS — J40 Bronchitis, not specified as acute or chronic: Secondary | ICD-10-CM | POA: Diagnosis not present

## 2015-02-01 DIAGNOSIS — R05 Cough: Secondary | ICD-10-CM | POA: Diagnosis not present

## 2015-03-01 ENCOUNTER — Telehealth: Payer: Self-pay | Admitting: Family Medicine

## 2015-05-20 DIAGNOSIS — J019 Acute sinusitis, unspecified: Secondary | ICD-10-CM | POA: Diagnosis not present

## 2015-06-17 ENCOUNTER — Ambulatory Visit (INDEPENDENT_AMBULATORY_CARE_PROVIDER_SITE_OTHER): Payer: Medicare Other | Admitting: Pediatrics

## 2015-06-17 ENCOUNTER — Ambulatory Visit (INDEPENDENT_AMBULATORY_CARE_PROVIDER_SITE_OTHER): Payer: Medicare Other

## 2015-06-17 ENCOUNTER — Encounter: Payer: Self-pay | Admitting: Pediatrics

## 2015-06-17 VITALS — BP 124/85 | HR 96 | Temp 98.9°F | Ht 65.0 in | Wt 230.4 lb

## 2015-06-17 DIAGNOSIS — M25572 Pain in left ankle and joints of left foot: Secondary | ICD-10-CM | POA: Diagnosis not present

## 2015-06-17 DIAGNOSIS — S93409A Sprain of unspecified ligament of unspecified ankle, initial encounter: Secondary | ICD-10-CM | POA: Insufficient documentation

## 2015-06-17 DIAGNOSIS — S93402A Sprain of unspecified ligament of left ankle, initial encounter: Secondary | ICD-10-CM

## 2015-06-17 NOTE — Patient Instructions (Signed)
Air cast or air splint for L ankle when walking on it  Can use crutches or a crutch to get around  Bear weight on L ankle as you are able to, will help you heal faster.  Can take  of ibuprofen a couple times a day for pain or tylenol

## 2015-06-17 NOTE — Progress Notes (Signed)
Subjective:    Patient ID: George Ballard, male    DOB: Jun 07, 1945, 69 y.o.   MRN: [id number]  CC: Ankle Pain   HPI: George Ballard is a 70 y.o. 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  Able to weight bear immediately following injury but with pain Now can walk on foot but with a limp Has not taken anything for it yet Never hurt this ankle before   Depression screen George Ballard 2/9 06/17/2015 11/30/2014 07/11/2014  Decreased Interest 0 0 0  Down, Depressed, Hopeless 0 0 0  PHQ - 2 Score 0 0 0     Relevant past medical, surgical, family and social history reviewed and updated as indicated. Interim medical history since our last visit reviewed. Allergies and medications reviewed and updated.    ROS: Per HPI unless specifically indicated above  History  Smoking status  . Never Smoker   Smokeless tobacco  . Never Used    Past Medical History Patient Active Problem List   Diagnosis Date Noted  . Sprain of ankle 06/17/2015  . DM (diabetes mellitus) (HCC)   . HTN (hypertension)   . GERD (gastroesophageal reflux disease)   . Kidney stones   . S/P right knee arthroscopy 12/07/2012  . OA (osteoarthritis) of knee 12/07/2012  . Right knee pain 12/07/2012    Current Outpatient Prescriptions  Medication Sig Dispense Refill  . ibuprofen (ADVIL,MOTRIN) 200 MG tablet Take 600 mg by mouth every 6 (six) hours as needed for pain.    Marland Kitchen losartan-hydrochlorothiazide (HYZAAR) 100-25 MG per tablet Take 1 tablet by mouth daily. 90 tablet 3  . metFORMIN (GLUCOPHAGE) 500 MG tablet Take 1 tablet (500 mg total) by mouth daily. 90 tablet 4  . omeprazole (PRILOSEC) 20 MG capsule Take 1 capsule (20 mg total) by mouth daily. 90 capsule 4   No current facility-administered medications for this visit.       Objective:    BP 124/85 mmHg  Pulse 96  Temp(Src) 98.9  F (37.2 C) (Oral)  Ht  (1.651 m)  Wt 230 lb 6.4 oz (104.509 kg)  BMI 38.34 kg/m2  Wt Readings from Last 3 Encounters:  06/17/15 230 lb 6.4 oz (104.509 kg)  11/30/14 246 lb (111.585 kg)  07/11/14 241 lb (109.317 kg)     Gen: NAD, alert, cooperative with exam, NCAT EYES: EOMI, no scleral injection or icterus ENT:  OP without erythema CV: WWP Resp:  normal WOB Ext: No edema, warm Neuro: Alert and oriented MSK:  No visible erythema or swelling. Pain with inversion L foot against resistance medial side of ankle, no pain with eversion, some pain with dorsi/plantar flexion against resistance.  Strength intact in all directions Stable lateral and medial ligaments No pain at base of 5th MT No tenderness over N spot or navicular prominence No tenderness on posterior aspects of lateral and medial malleolus No sign of peroneal tendon subluxations or tenderness to palpation Able to weight bear on foot, though with some pain   Preliminary read by Rex Kras, MD:  No acute fractures     Assessment & Plan:    Narayan was seen today for ankle pain, likely due to sprain that occurred today. Rec rest, ice, wearing air splint, elevation. Continue to weight bear as tolerated. Will follow up final read of xray.  Diagnoses and all orders for this  visit:  Ankle pain, left -     DG Ankle Complete Left; Future  Sprain of ankle, left, initial encounter   Follow up plan: Return in about 4 weeks (around 07/15/2015).  Rex Kras, MD [facility] 06/17/2015, 3:53 PM

## 2015-06-27 LAB — BASIC METABOLIC PANEL
BUN: 16 mg/dL (ref 4–21)
CREATININE: 1 mg/dL (ref 0.6–1.3)
Potassium: 3.9 mmol/L (ref 3.4–5.3)
Sodium: 144 mmol/L (ref 137–147)

## 2015-06-27 LAB — LIPID PANEL
Cholesterol: 145 mg/dL (ref 0–200)
HDL: 35 mg/dL (ref 35–70)
LDL Cholesterol: 83 mg/dL
Triglycerides: 135 mg/dL (ref 40–160)

## 2015-06-27 LAB — CBC AND DIFFERENTIAL
HEMATOCRIT: 44 % (ref 41–53)
Hemoglobin: 14.8 g/dL (ref 13.5–17.5)
PLATELETS: 241 10*3/uL (ref 150–399)
WBC: 8.6 10^3/mL

## 2015-06-27 LAB — HEPATIC FUNCTION PANEL
ALT: 100 U/L — AB (ref 10–40)
AST: 85 U/L — AB (ref 14–40)
Alkaline Phosphatase: 74 U/L (ref 25–125)
BILIRUBIN DIRECT: 0.2 mg/dL (ref 0.01–0.4)
Bilirubin, Total: 0.5 mg/dL

## 2015-06-27 LAB — PSA: PSA: 0.595

## 2015-06-27 LAB — HEMOGLOBIN A1C: HEMOGLOBIN A1C: 9.9

## 2015-06-28 LAB — HM DIABETES EYE EXAM

## 2015-07-22 ENCOUNTER — Encounter: Payer: Self-pay | Admitting: Pediatrics

## 2015-07-22 ENCOUNTER — Encounter (INDEPENDENT_AMBULATORY_CARE_PROVIDER_SITE_OTHER): Payer: Self-pay

## 2015-07-22 ENCOUNTER — Ambulatory Visit (INDEPENDENT_AMBULATORY_CARE_PROVIDER_SITE_OTHER): Payer: Medicare Other | Admitting: Pediatrics

## 2015-07-22 VITALS — BP 135/83 | HR 76 | Temp 98.2°F | Ht 65.0 in | Wt 231.8 lb

## 2015-07-22 DIAGNOSIS — E119 Type 2 diabetes mellitus without complications: Secondary | ICD-10-CM | POA: Diagnosis not present

## 2015-07-22 DIAGNOSIS — I10 Essential (primary) hypertension: Secondary | ICD-10-CM | POA: Diagnosis not present

## 2015-07-22 DIAGNOSIS — S93402D Sprain of unspecified ligament of left ankle, subsequent encounter: Secondary | ICD-10-CM

## 2015-07-22 NOTE — Progress Notes (Signed)
    Subjective:    Patient ID: George Ballard, male    DOB: 1946/01/16, 70 y.o.   MRN: [id number]  CC: Follow-up ankle pain  HPI: George Ballard is a 70 y.o. male presenting for Follow-up  Of ankle Turned black and blue after initial visit Now walking on it normally Still wears sleeve for support at times Follows at the [facility] in [place] for DM2, eye exam, recently had normal stress test, both a treadmill test and NM, he said he would bring results by when he receives them in the mail [facility] is [facility] BGLs  No HA, vision changes   Depression screen [facility] 2/9 07/22/2015 06/17/2015 11/30/2014 07/11/2014  Decreased Interest 0 0 0 0  Down, Depressed, Hopeless 0 0 0 0  PHQ - 2 Score 0 0 0 0     Relevant past medical, surgical, family and social history reviewed and updated as indicated. Interim medical history since our last visit reviewed. Allergies and medications reviewed and updated.    ROS: Per HPI unless specifically indicated above  History  Smoking status  . Never Smoker   Smokeless tobacco  . Never Used    Past Medical History Patient Active Problem List   Diagnosis Date Noted  . Sprain of ankle 06/17/2015  . DM (diabetes mellitus) (HCC)   . HTN (hypertension)   . GERD (gastroesophageal reflux disease)   . Kidney stones   . S/P right knee arthroscopy 12/07/2012  . OA (osteoarthritis) of knee 12/07/2012  . Right knee pain 12/07/2012        Objective:    BP 135/83 mmHg  Pulse 76  Temp(Src) 98.2 F (36.8 C) (Oral)  Ht  (1.651 m)  Wt 231 lb 12.8 oz (105.144 kg)  BMI 38.57 kg/m2  Wt Readings from Last 3 Encounters:  07/22/15 231 lb 12.8 oz (105.144 kg)  06/17/15 230 lb 6.4 oz (104.509 kg)  11/30/14 246 lb (111.585 kg)     Gen: NAD, alert, cooperative with exam, NCAT EYES: EOMI, no scleral injection or icterus ENT: MMM CV: WWP Resp: normal WOB Ext: No edema, warm Neuro: Alert and oriented MSK: Normal ROM L ankle,  slight sensitivity with eversion against pressure. No bruising, no point tenderness     Assessment & Plan:    George Ballard was seen today for follow-up ankle and med problems.  Diagnoses and all orders for this visit:  Sprain of ankle, left, subsequent encounter Improving function, continue slow increase in activity  Type 2 diabetes mellitus without complication, without long-term current use of insulin (HCC) Followed at the [facility] exam recently there  Essential hypertension Well controlled today, continue current meds   Follow up plan: Return if symptoms worsen or fail to improve.  Rex Kras, MD [facility] 07/22/2015, 9:00 AM

## 2015-08-06 ENCOUNTER — Encounter: Payer: Self-pay | Admitting: *Deleted

## 2015-08-08 LAB — HM DIABETES EYE EXAM

## 2015-08-17 ENCOUNTER — Other Ambulatory Visit: Payer: Self-pay | Admitting: Family Medicine

## 2015-08-23 ENCOUNTER — Encounter: Payer: Self-pay | Admitting: Pediatrics

## 2015-08-26 ENCOUNTER — Other Ambulatory Visit: Payer: Self-pay | Admitting: Family Medicine

## 2015-09-12 ENCOUNTER — Encounter: Payer: Self-pay | Admitting: Pediatrics

## 2015-11-27 ENCOUNTER — Telehealth: Payer: Self-pay | Admitting: Family Medicine

## 2015-11-27 MED ORDER — METFORMIN HCL 1000 MG PO TABS: 1000.0000 mg | ORAL_TABLET | Freq: Every day | ORAL | Status: AC

## 2015-11-27 NOTE — Telephone Encounter (Signed)
Patient aware and verbalizes understanding. 

## 2015-11-27 NOTE — Telephone Encounter (Signed)
Patient states he normally gets his Metformin from the [facility] doctor but his PCP is Dr.Stacks. Patient states that if he gets is metformin local it would be cheaper, so he would like Dr.Stacks to re fill his medication. Patient states that he was taking Metformin 500mg  as we have in our system but it was increased to 1000mg . Advised patient that he needs to be seen since he has not been seen for regular check up since 7/18/7/16 but has only had acute visits after that. Patient states he would make an apt but he will be out of the metformin in 3 days and needs a refill. Please advise, your covering PCP.

## 2015-11-27 NOTE — Telephone Encounter (Signed)
Sent in 30 days worth of metformin 1000mg , take bid. Pt needs to be seen for further refills. Should bring any lab results for past year.

## 2015-12-17 ENCOUNTER — Other Ambulatory Visit: Payer: Self-pay | Admitting: Family Medicine

## 2015-12-20 ENCOUNTER — Telehealth: Payer: Self-pay | Admitting: Family Medicine

## 2015-12-20 ENCOUNTER — Other Ambulatory Visit: Payer: Self-pay | Admitting: *Deleted

## 2015-12-20 MED ORDER — LOSARTAN POTASSIUM-HCTZ 100-25 MG PO TABS: 1.0000 | ORAL_TABLET | Freq: Every day | ORAL | 0 refills | Status: AC

## 2016-01-07 ENCOUNTER — Ambulatory Visit: Payer: Medicare Other | Admitting: Family Medicine

## 2016-03-11 ENCOUNTER — Telehealth: Payer: Self-pay | Admitting: Family Medicine

## 2016-07-09 DIAGNOSIS — J111 Influenza due to unidentified influenza virus with other respiratory manifestations: Secondary | ICD-10-CM | POA: Diagnosis not present

## 2016-07-09 DIAGNOSIS — R6883 Chills (without fever): Secondary | ICD-10-CM | POA: Diagnosis not present

## 2016-10-13 DIAGNOSIS — E109 Type 1 diabetes mellitus without complications: Secondary | ICD-10-CM | POA: Diagnosis not present

## 2016-10-13 DIAGNOSIS — Z6834 Body mass index (BMI) 34.0-34.9, adult: Secondary | ICD-10-CM | POA: Diagnosis not present

## 2016-10-13 DIAGNOSIS — Z7984 Long term (current) use of oral hypoglycemic drugs: Secondary | ICD-10-CM | POA: Diagnosis not present

## 2016-10-13 DIAGNOSIS — H900 Conductive hearing loss, bilateral: Secondary | ICD-10-CM | POA: Diagnosis not present

## 2016-10-13 DIAGNOSIS — M1711 Unilateral primary osteoarthritis, right knee: Secondary | ICD-10-CM | POA: Diagnosis not present

## 2016-10-13 DIAGNOSIS — K219 Gastro-esophageal reflux disease without esophagitis: Secondary | ICD-10-CM | POA: Diagnosis not present

## 2016-10-13 DIAGNOSIS — I10 Essential (primary) hypertension: Secondary | ICD-10-CM | POA: Diagnosis not present

## 2017-02-17 DIAGNOSIS — Z23 Encounter for immunization: Secondary | ICD-10-CM | POA: Diagnosis not present

## 2017-04-26 IMAGING — CR DG ANKLE COMPLETE 3+V*L*
3 series · 3 of 3 positions shown · non-contrast
Comparison: None.

CLINICAL DATA: Fall with left ankle pain.

EXAM:
LEFT ANKLE COMPLETE - 3+ VIEW

[view not recorded (1 of 3)]
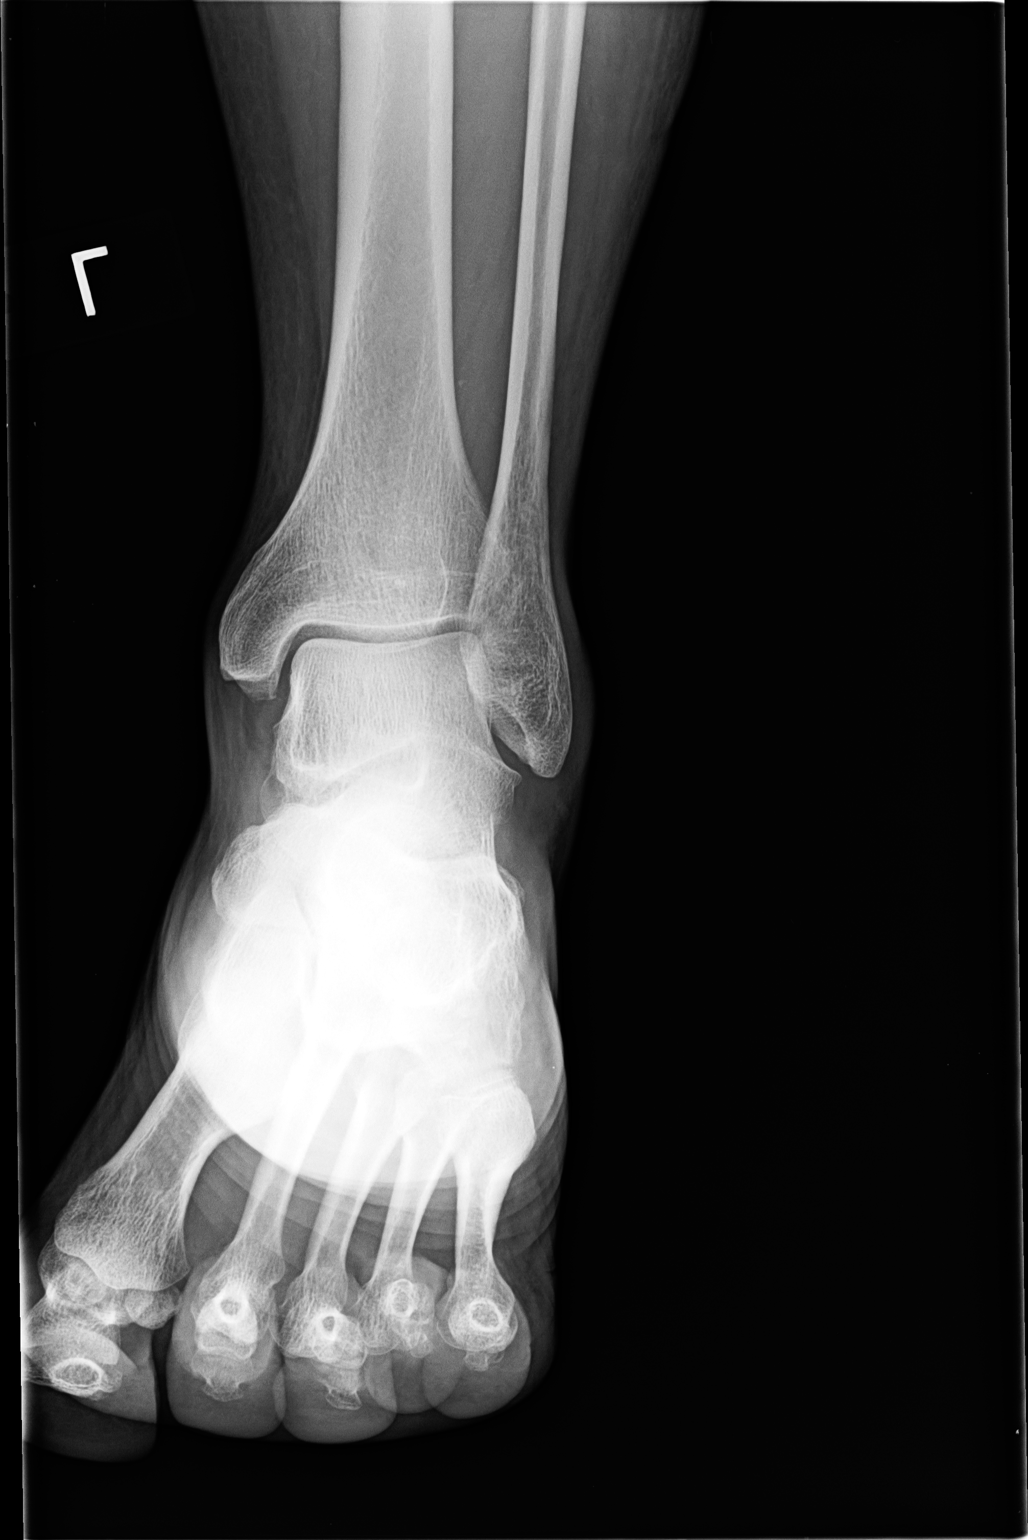

[view not recorded (2 of 3)]
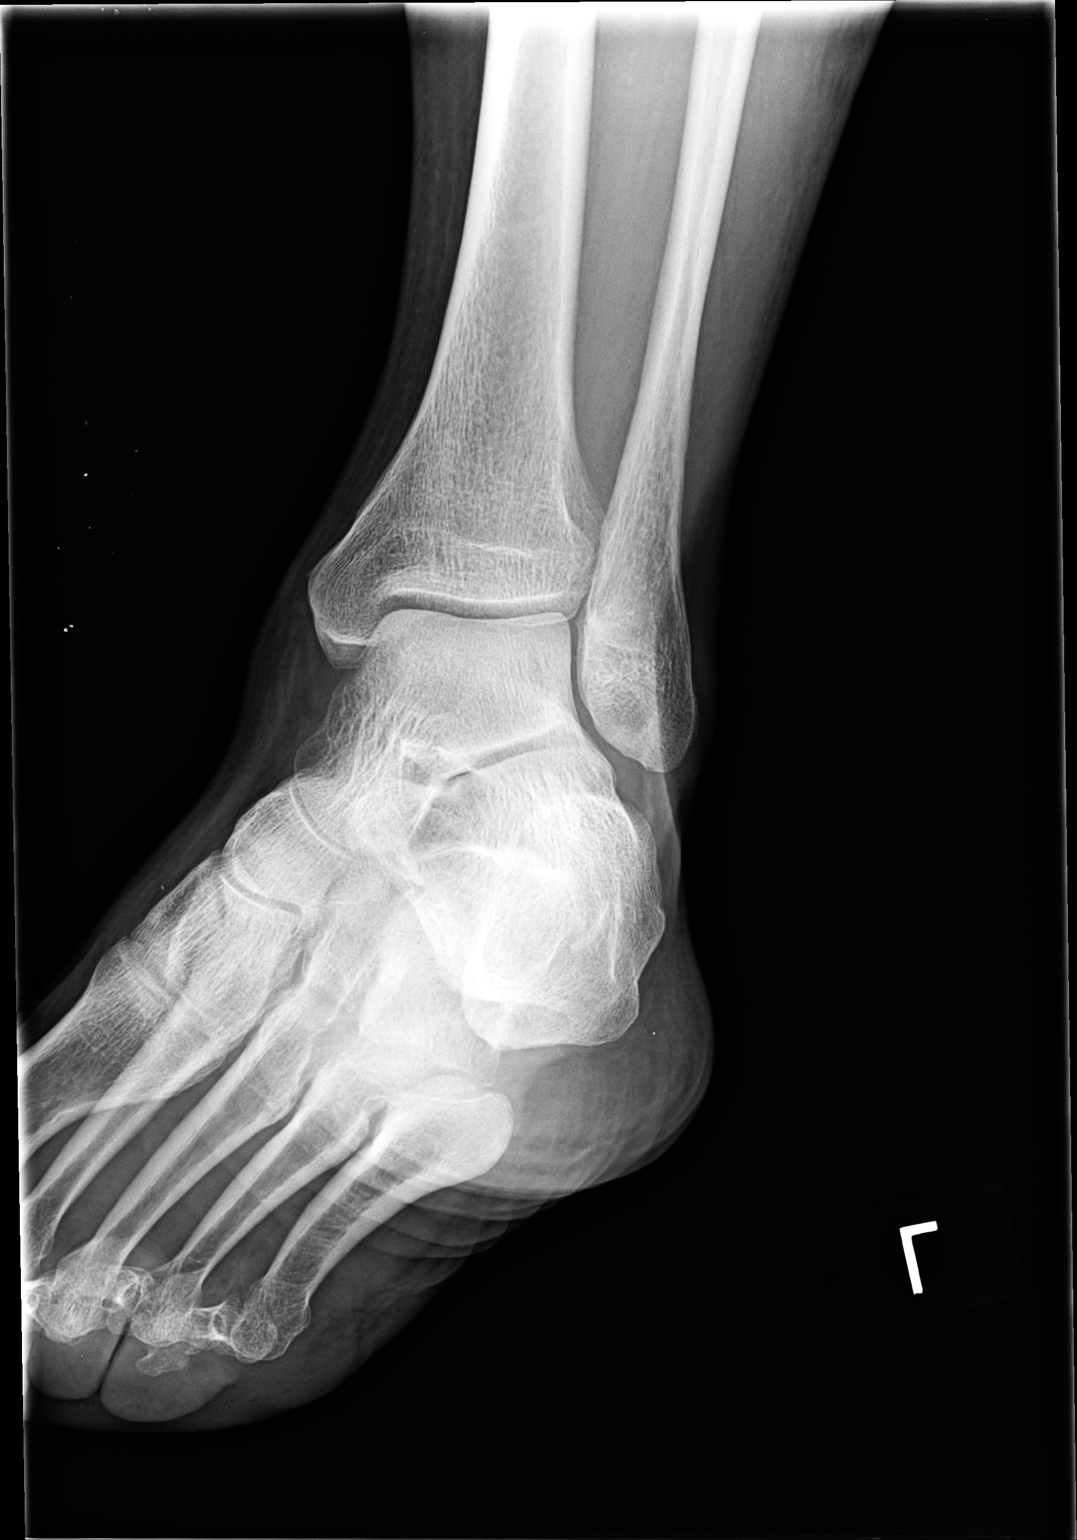

[view not recorded (3 of 3)]
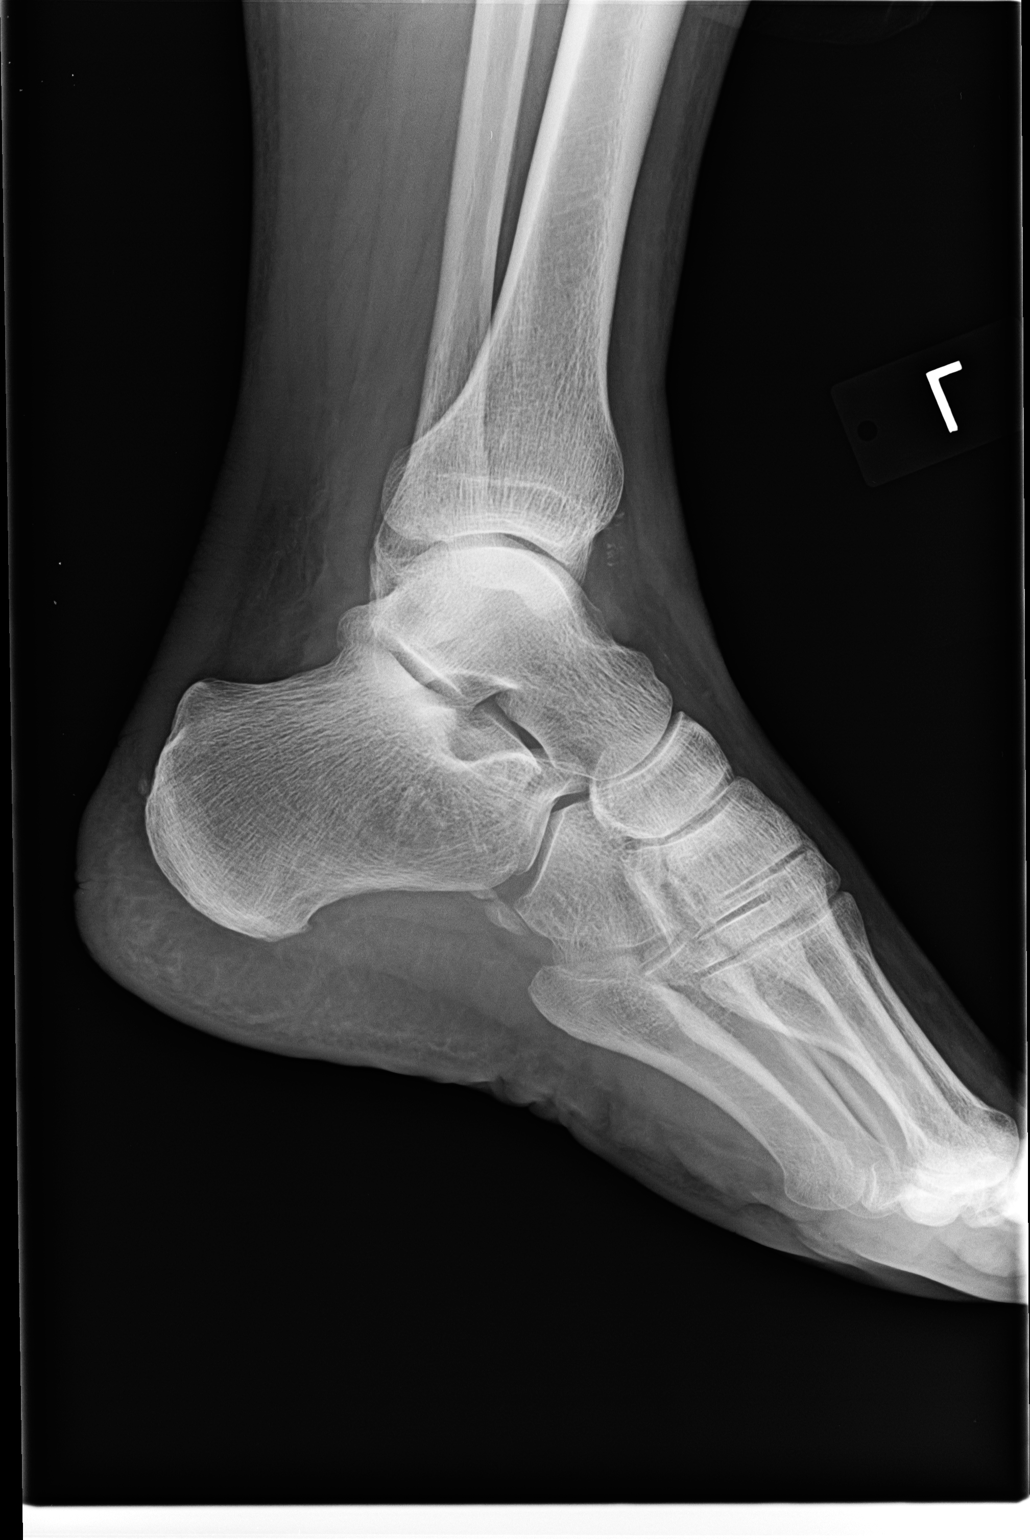

[3 of 3 positions shown; findings below may reference images not displayed]

FINDINGS: There is no evidence of fracture, dislocation, or joint effusion.
There is no evidence of arthropathy or other focal bone abnormality.
Soft tissues are unremarkable.
IMPRESSION: Negative.

## 2022-02-18 NOTE — Progress Notes (Signed)
No chief complaint on file.
